# Patient Record
Sex: Female | Born: 2000 | Race: Black or African American | Hispanic: No | Marital: Single
Health system: Midwestern US, Academic
[De-identification: ages and names within clinical notes are randomized; demographics above are authoritative.]

## PROBLEM LIST (undated history)

## (undated) DIAGNOSIS — L309 Dermatitis, unspecified: Secondary | ICD-10-CM

## (undated) DIAGNOSIS — J302 Other seasonal allergic rhinitis: Secondary | ICD-10-CM

## (undated) DIAGNOSIS — E669 Obesity, unspecified: Secondary | ICD-10-CM

## (undated) HISTORY — DX: Obesity, unspecified: E66.9

---
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
## 2023-09-19 ENCOUNTER — Emergency Department (HOSPITAL_COMMUNITY): Payer: Self-pay

## 2023-09-19 ENCOUNTER — Emergency Department (HOSPITAL_COMMUNITY)
Admission: EM | Admit: 2023-09-19 | Discharge: 2023-09-19 | Disposition: A | Discharge status: Home / Self Care | Payer: Self-pay | Attending: Emergency Medicine | Admitting: Emergency Medicine

## 2023-09-19 DIAGNOSIS — Z91010 Allergy to peanuts: Secondary | ICD-10-CM | POA: Insufficient documentation

## 2023-09-19 DIAGNOSIS — S0990XA Unspecified injury of head, initial encounter: Secondary | ICD-10-CM | POA: Insufficient documentation

## 2023-09-19 DIAGNOSIS — W228XXA Striking against or struck by other objects, initial encounter: Secondary | ICD-10-CM | POA: Insufficient documentation

## 2023-09-19 DIAGNOSIS — R55 Syncope and collapse: Secondary | ICD-10-CM | POA: Insufficient documentation

## 2023-09-19 LAB — CBC WITH DIFFERENTIAL/PLATELET
Abs Immature Granulocytes: 0.02 K/uL (ref 0.00–0.07)
Basophils Absolute: 0 K/uL (ref 0.0–0.1)
Basophils Relative: 1 %
Eosinophils Absolute: 0.1 K/uL (ref 0.0–0.5)
Eosinophils Relative: 1 %
HCT: 44.4 % (ref 36.0–46.0)
Hemoglobin: 13.5 g/dL (ref 12.0–15.0)
Immature Granulocytes: 0 %
Lymphocytes Relative: 32 %
Lymphs Abs: 2.1 K/uL (ref 0.7–4.0)
MCH: 27.5 pg (ref 26.0–34.0)
MCHC: 30.4 g/dL (ref 30.0–36.0)
MCV: 90.4 fL (ref 80.0–100.0)
Monocytes Absolute: 0.5 K/uL (ref 0.1–1.0)
Monocytes Relative: 7 %
Neutro Abs: 4 K/uL (ref 1.7–7.7)
Neutrophils Relative %: 59 %
Platelets: 362 K/uL (ref 150–400)
RBC: 4.91 MIL/uL (ref 3.87–5.11)
RDW: 13.1 % (ref 11.5–15.5)
WBC: 6.7 K/uL (ref 4.0–10.5)
nRBC: 0 % (ref 0.0–0.2)

## 2023-09-19 LAB — BASIC METABOLIC PANEL WITH GFR
Anion gap: 8 (ref 5–15)
BUN: 11 mg/dL (ref 6–20)
CO2: 24 mmol/L (ref 22–32)
Calcium: 9 mg/dL (ref 8.9–10.3)
Chloride: 105 mmol/L (ref 98–111)
Creatinine, Ser: 0.9 mg/dL (ref 0.44–1.00)
GFR, Estimated: >60 mL/min (ref >60)
Glucose, Bld: 89 mg/dL (ref 70–99)
Potassium: 4.2 mmol/L (ref 3.5–5.1)
Sodium: 137 mmol/L (ref 135–145)

## 2023-09-19 LAB — HCG, SERUM, QUALITATIVE: Preg, Serum: NEGATIVE

## 2023-09-19 LAB — CBG MONITORING, ED: Glucose-Capillary: 72 mg/dL (ref 70–99)

## 2023-09-19 MED ORDER — IBUPROFEN 200 MG PO TABS
600.0000 mg | ORAL_TABLET | Freq: Once | ORAL | Status: AC
  Administered 2023-09-19: 600 mg via ORAL
  Filled 2023-09-19: qty 3

## 2023-09-19 NOTE — Discharge Instructions (Signed)
 Your blood work was reassuring.  EKG did not show any concerning findings.  Please follow-up with your primary care doctor.  In the meantime increase your water intake.  If you drink lots of caffeine cut back on this.  Return for any emergent symptoms.

## 2023-09-19 NOTE — ED Provider Notes (Signed)
 [place] EMERGENCY DEPARTMENT AT [facility] Provider Note   CSN: [id number] Arrival date & time: 09/19/23  1425     Patient presents with: Loss of Consciousness   Kendra Black is a 23 y.o. female.   Pt complains of passing out and hitting her head.  Pt states she felt hot before she passed out.  Pt reports she hit her head hard.  Pt complains of a headache.  Not on blood thinning medications.  No chest pain, shortness of breath or other anginal symptoms.  Reports history of the same several years ago which was related to dehydration and felt the same.  The history is provided by the patient. No language interpreter was used.       Prior to Admission medications   Medication Sig Start Date End Date Taking? Authorizing Provider  amoxicillin  (AMOXIL ) 400 MG/5ML suspension Take 10mL twice daily until gone. 02/02/20   Rolinda Rogue, MD  cetirizine  (ZYRTEC ) 10 MG tablet TAKE 1 TABLET BY MOUTH AT BEDTIME FOR ALLERGY SYMPTOMS 05/11/15   Claudene Mardeen SQUIBB, MD  clobetasol  ointment (TEMOVATE ) 0.05 % Apply 1 application topically 2 (two) times daily. 09/09/14   Claudene Mardeen SQUIBB, MD  Desoximetasone  (TOPICORT ) 0.25 % ointment Apply sparingly to areas of eczema twice a day as needed; layer moisturizer over this. 08/08/14   Taft Jon PARAS, MD  diphenhydrAMINE  (BENADRYL ) 12.5 MG/5ML elixir Take 12.5 mg by mouth daily as needed. For allergies    [provider]  EPINEPHrine  (EPIPEN  2-PAK) 0.3 mg/0.3 mL IJ SOAJ injection Inject 0.3 mLs (0.3 mg total) into the muscle once. May repeat after 15 min. 09/09/14   Claudene Mardeen SQUIBB, MD    Allergies: Tully nevins persica] and Peanuts [peanut oil]    Review of Systems  Constitutional:  Negative for fever.  Eyes:  Negative for photophobia and visual disturbance.  Respiratory:  Negative for shortness of breath.   Cardiovascular:  Negative for chest pain.  Neurological:  Positive for syncope and headaches.  All other systems reviewed and are  negative.   Updated Vital Signs BP (!) 146/72   Pulse 78   Temp 98.3 F (36.8 C) (Oral)   Resp 17   Ht 5' 7 (1.702 m)   Wt 122.5 kg   SpO2 100%   BMI 42.29 kg/m   Physical Exam Vitals and nursing note reviewed.  Constitutional:      General: She is not in acute distress.    Appearance: Normal appearance. She is not ill-appearing.  HENT:     Head: Normocephalic and atraumatic.     Nose: Nose normal.  Eyes:     Extraocular Movements: Extraocular movements intact.     Conjunctiva/sclera: Conjunctivae normal.  Cardiovascular:     Rate and Rhythm: Normal rate and regular rhythm.  Pulmonary:     Effort: Pulmonary effort is normal. No respiratory distress.  Musculoskeletal:        General: No deformity. Normal range of motion.     Cervical back: Normal range of motion.  Skin:    Findings: No rash.  Neurological:     General: No focal deficit present.     Mental Status: She is alert and oriented to person, place, and time. Mental status is at baseline.     Comments: Range of motion in bilateral upper and lower extremities.  5/5 strength in extensor and flexor muscle groups.  Cranial nerves III through XII intact.  No pronator drift.     (all labs  ordered are listed, but only abnormal results are displayed) Labs Reviewed  BASIC METABOLIC PANEL WITH GFR  CBC WITH DIFFERENTIAL/PLATELET  HCG, SERUM, QUALITATIVE  CBG MONITORING, ED    EKG: EKG Interpretation Date/Time:  Friday September 19 2023 14:45:51 EDT Ventricular Rate:  74 PR Interval:  129 QRS Duration:  75 QT Interval:  376 QTC Calculation: 418 R Axis:   63  Text Interpretation: Sinus rhythm Confirmed by Francesca Fallow ([id number]) on 09/19/2023 4:43:08 PM  Radiology: CT Head Wo Contrast Result Date: 09/19/2023 CLINICAL DATA:  Head trauma, moderate-severe.  Syncopal episode. EXAM: CT HEAD WITHOUT CONTRAST TECHNIQUE: Contiguous axial images were obtained from the base of the skull through the vertex without  intravenous contrast. RADIATION DOSE REDUCTION: This exam was performed according to the departmental dose-optimization program which includes automated exposure control, adjustment of the mA and/or kV according to patient size and/or use of iterative reconstruction technique. COMPARISON:  None Available. FINDINGS: Brain: The brain shows a normal appearance without evidence of malformation, atrophy, old or acute small or large vessel infarction, mass lesion, hemorrhage, hydrocephalus or extra-axial collection. Vascular: No hyperdense vessel. No evidence of atherosclerotic calcification. Skull: Normal.  No traumatic finding.  No focal bone lesion. Sinuses/Orbits: Sinuses are clear. Orbits appear normal. Mastoids are clear. Other: None significant IMPRESSION: Normal head CT. Electronically Signed   By: Oneil Officer M.D.   On: 09/19/2023 16:14     Procedures   Medications Ordered in the ED  ibuprofen  (ADVIL ) tablet 600 mg (600 mg Oral Given 09/19/23 1639)                                    Medical Decision Making Amount and/or Complexity of Data Reviewed Labs: ordered.  Risk OTC drugs.   23 year old female presents today for concern of Esmail and hitting her head.  Has headache otherwise denies any complaints.  She felt hot prior to passing out.  She states that she was trying to move the flow of the fan towards her and the next and she remembers she woke up on the ground.  No chest pain or shortness of breath or other anginal symptoms. CBC, BMP unremarkable.  Glucose 72.  Pregnancy test negative.  CT head shows no acute intracranial finding.  I reviewed this and agree with radiology interpretation.  Motrin  given.  Discussed increasing her hydration.  Mom is at bedside . She is stable for discharge. They voiced understanding and are in agreement with plan.  Final diagnoses:  Injury of head, initial encounter  Syncope and collapse    ED Discharge Orders     None          Hildegard Loge, PA-C 09/19/23 1705    Francesca Fallow CROME, MD 09/19/23 2250

## 2023-09-19 NOTE — ED Provider Triage Note (Signed)
 Emergency Medicine Provider Triage Evaluation Note  Kendra Black , a 23 y.o. female  was evaluated in triage.  Pt complains of passing out and hitting her head.  Pt states she felt hot before she passed out.  Pt reports she hit her head hard.  Pt complains of a headache  Review of Systems  Positive: Headache and syncope Negative: Fever or chills  Physical Exam  BP (!) 141/82 (BP Location: Left Arm)   Pulse 83   Temp 98.3 F (36.8 C) (Oral)   Resp 18   Ht 5' 7 (1.702 m)   Wt 122.5 kg   SpO2 93%   BMI 42.29 kg/m  Gen:   Awake, no distress   Resp:  Normal effort  MSK:   Moves extremities without difficulty  Other:    Medical Decision Making  Medically screening exam initiated at 3:01 PM.  Appropriate orders placed.  Kendra Black was informed that the remainder of the evaluation will be completed by another provider, this initial triage assessment does not replace that evaluation, and the importance of remaining in the ED until their evaluation is complete.     Kendra Sonny POUR, PA-C 09/19/23 [phone]

## 2023-09-19 NOTE — ED Triage Notes (Signed)
 BIBA from work- pt states she got hot, then had a syncopal episode. Pt c/o right side head, shoulder pain, dizziness. 130/76 BP 76 HR 100% R/A 103 cbg 18 gauge lac
